# Patient Record
Sex: Male | Born: 1945 | Race: Black or African American | Hispanic: No
Health system: Southern US, Community
[De-identification: ages and names within clinical notes are randomized; demographics above are authoritative.]

---

## 2014-09-06 ENCOUNTER — Inpatient Hospital Stay
Admission: AD | Admit: 2014-09-06 | Discharge: 2014-09-27 | Disposition: E | Payer: Self-pay | Source: Ambulatory Visit | Attending: Internal Medicine | Admitting: Internal Medicine

## 2014-09-06 ENCOUNTER — Other Ambulatory Visit (HOSPITAL_COMMUNITY): Payer: Self-pay

## 2014-09-07 LAB — MAGNESIUM: Magnesium: 1.3 mg/dL — ABNORMAL LOW (ref 1.5–2.5)

## 2014-09-07 LAB — CBC WITH DIFFERENTIAL/PLATELET
BASOS ABS: 0 10*3/uL (ref 0.0–0.1)
BASOS PCT: 0 % (ref 0–1)
EOS ABS: 0.1 10*3/uL (ref 0.0–0.7)
Eosinophils Relative: 1 % (ref 0–5)
HEMATOCRIT: 29.1 % — AB (ref 39.0–52.0)
Hemoglobin: 10 g/dL — ABNORMAL LOW (ref 13.0–17.0)
LYMPHS ABS: 2.5 10*3/uL (ref 0.7–4.0)
Lymphocytes Relative: 17 % (ref 12–46)
MCH: 32.4 pg (ref 26.0–34.0)
MCHC: 34.4 g/dL (ref 30.0–36.0)
MCV: 94.2 fL (ref 78.0–100.0)
Monocytes Absolute: 1.3 10*3/uL — ABNORMAL HIGH (ref 0.1–1.0)
Monocytes Relative: 9 % (ref 3–12)
NEUTROS ABS: 10.8 10*3/uL — AB (ref 1.7–7.7)
Neutrophils Relative %: 73 % (ref 43–77)
Platelets: 110 10*3/uL — ABNORMAL LOW (ref 150–400)
RBC: 3.09 MIL/uL — ABNORMAL LOW (ref 4.22–5.81)
RDW: 18.7 % — AB (ref 11.5–15.5)
WBC: 14.7 10*3/uL — ABNORMAL HIGH (ref 4.0–10.5)

## 2014-09-07 LAB — PRO B NATRIURETIC PEPTIDE: Pro B Natriuretic peptide (BNP): 39684 pg/mL — ABNORMAL HIGH (ref 0–125)

## 2014-09-07 LAB — COMPREHENSIVE METABOLIC PANEL
ALT: 8 U/L (ref 0–53)
ANION GAP: 14 (ref 5–15)
AST: 23 U/L (ref 0–37)
Albumin: 2.7 g/dL — ABNORMAL LOW (ref 3.5–5.2)
Alkaline Phosphatase: 40 U/L (ref 39–117)
BUN: 18 mg/dL (ref 6–23)
CALCIUM: 7.4 mg/dL — AB (ref 8.4–10.5)
CO2: 18 mEq/L — ABNORMAL LOW (ref 19–32)
Chloride: 113 mEq/L — ABNORMAL HIGH (ref 96–112)
Creatinine, Ser: 1.18 mg/dL (ref 0.50–1.35)
GFR calc Af Amer: 71 mL/min — ABNORMAL LOW (ref >90)
GFR, EST NON AFRICAN AMERICAN: 62 mL/min — AB (ref >90)
Glucose, Bld: 230 mg/dL — ABNORMAL HIGH (ref 70–99)
Potassium: 4.4 mEq/L (ref 3.7–5.3)
Sodium: 145 mEq/L (ref 137–147)
Total Bilirubin: 0.5 mg/dL (ref 0.3–1.2)
Total Protein: 5.4 g/dL — ABNORMAL LOW (ref 6.0–8.3)

## 2014-09-07 LAB — URINALYSIS, ROUTINE W REFLEX MICROSCOPIC
Bilirubin Urine: NEGATIVE
Glucose, UA: NEGATIVE mg/dL
KETONES UR: NEGATIVE mg/dL
Nitrite: NEGATIVE
PH: 5.5 (ref 5.0–8.0)
Protein, ur: NEGATIVE mg/dL
Specific Gravity, Urine: 1.022 (ref 1.005–1.030)
Urobilinogen, UA: 0.2 mg/dL (ref 0.0–1.0)

## 2014-09-07 LAB — URINE MICROSCOPIC-ADD ON

## 2014-09-07 LAB — IRON AND TIBC
Iron: 32 ug/dL — ABNORMAL LOW (ref 42–135)
Saturation Ratios: 41 % (ref 20–55)
TIBC: 78 ug/dL — ABNORMAL LOW (ref 215–435)
UIBC: 46 ug/dL — ABNORMAL LOW (ref 125–400)

## 2014-09-07 LAB — PROTIME-INR
INR: 2.61 — ABNORMAL HIGH (ref 0.00–1.49)
Prothrombin Time: 27.9 seconds — ABNORMAL HIGH (ref 11.6–15.2)

## 2014-09-07 LAB — T4, FREE: Free T4: 0.83 ng/dL (ref 0.80–1.80)

## 2014-09-07 LAB — PREALBUMIN: PREALBUMIN: 6 mg/dL — AB (ref 17.0–34.0)

## 2014-09-07 LAB — LIPID PANEL
CHOL/HDL RATIO: 1.4 ratio
Cholesterol: 48 mg/dL (ref 0–200)
HDL: 35 mg/dL — AB (ref >39)
LDL CALC: 1 mg/dL (ref 0–99)
TRIGLYCERIDES: 58 mg/dL (ref <150)
VLDL: 12 mg/dL (ref 0–40)

## 2014-09-07 LAB — PROCALCITONIN: PROCALCITONIN: 0.3 ng/mL

## 2014-09-07 LAB — TSH: TSH: 11.66 u[IU]/mL — ABNORMAL HIGH (ref 0.350–4.500)

## 2014-09-07 LAB — PHOSPHORUS: PHOSPHORUS: 1.9 mg/dL — AB (ref 2.3–4.6)

## 2014-09-07 LAB — VANCOMYCIN, RANDOM: VANCOMYCIN RM: 15.4 ug/mL

## 2014-09-07 LAB — VITAMIN B12: Vitamin B-12: 751 pg/mL (ref 211–911)

## 2014-09-08 ENCOUNTER — Other Ambulatory Visit (HOSPITAL_COMMUNITY): Payer: Self-pay

## 2014-09-08 LAB — BASIC METABOLIC PANEL
ANION GAP: 11 (ref 5–15)
BUN: 21 mg/dL (ref 6–23)
CALCIUM: 7.5 mg/dL — AB (ref 8.4–10.5)
CO2: 19 meq/L (ref 19–32)
CREATININE: 1.18 mg/dL (ref 0.50–1.35)
Chloride: 115 mEq/L — ABNORMAL HIGH (ref 96–112)
GFR calc non Af Amer: 62 mL/min — ABNORMAL LOW (ref >90)
GFR, EST AFRICAN AMERICAN: 71 mL/min — AB (ref >90)
Glucose, Bld: 189 mg/dL — ABNORMAL HIGH (ref 70–99)
Potassium: 4.4 mEq/L (ref 3.7–5.3)
SODIUM: 145 meq/L (ref 137–147)

## 2014-09-08 LAB — URINE CULTURE
COLONY COUNT: NO GROWTH
CULTURE: NO GROWTH
Special Requests: NORMAL

## 2014-09-08 LAB — CBC WITH DIFFERENTIAL/PLATELET
Basophils Absolute: 0 10*3/uL (ref 0.0–0.1)
Basophils Relative: 0 % (ref 0–1)
EOS PCT: 0 % (ref 0–5)
Eosinophils Absolute: 0 10*3/uL (ref 0.0–0.7)
HCT: 27.6 % — ABNORMAL LOW (ref 39.0–52.0)
Hemoglobin: 9.7 g/dL — ABNORMAL LOW (ref 13.0–17.0)
Lymphocytes Relative: 13 % (ref 12–46)
Lymphs Abs: 2 10*3/uL (ref 0.7–4.0)
MCH: 33.1 pg (ref 26.0–34.0)
MCHC: 35.1 g/dL (ref 30.0–36.0)
MCV: 94.2 fL (ref 78.0–100.0)
MONO ABS: 1.5 10*3/uL — AB (ref 0.1–1.0)
MONOS PCT: 10 % (ref 3–12)
NEUTROS PCT: 77 % (ref 43–77)
Neutro Abs: 11.6 10*3/uL — ABNORMAL HIGH (ref 1.7–7.7)
Platelets: 99 10*3/uL — ABNORMAL LOW (ref 150–400)
RBC: 2.93 MIL/uL — AB (ref 4.22–5.81)
RDW: 18.4 % — ABNORMAL HIGH (ref 11.5–15.5)
WBC: 15.1 10*3/uL — AB (ref 4.0–10.5)

## 2014-09-08 LAB — MAGNESIUM: Magnesium: 1.8 mg/dL (ref 1.5–2.5)

## 2014-09-08 LAB — PHOSPHORUS: Phosphorus: 2.7 mg/dL (ref 2.3–4.6)

## 2014-09-08 LAB — FOLATE RBC: RBC Folate: 1054 ng/mL — ABNORMAL HIGH (ref >280)

## 2014-09-08 LAB — HEMOGLOBIN A1C
Hgb A1c MFr Bld: 5.8 % — ABNORMAL HIGH (ref <5.7)
Mean Plasma Glucose: 120 mg/dL — ABNORMAL HIGH (ref <117)

## 2014-09-09 ENCOUNTER — Other Ambulatory Visit (HOSPITAL_COMMUNITY): Payer: Self-pay

## 2014-09-09 LAB — MAGNESIUM: MAGNESIUM: 1.6 mg/dL (ref 1.5–2.5)

## 2014-09-09 LAB — CBC WITH DIFFERENTIAL/PLATELET
BASOS ABS: 0 10*3/uL (ref 0.0–0.1)
Basophils Relative: 0 % (ref 0–1)
EOS ABS: 0.2 10*3/uL (ref 0.0–0.7)
Eosinophils Relative: 1 % (ref 0–5)
HCT: 30.6 % — ABNORMAL LOW (ref 39.0–52.0)
Hemoglobin: 10.6 g/dL — ABNORMAL LOW (ref 13.0–17.0)
LYMPHS ABS: 2.4 10*3/uL (ref 0.7–4.0)
Lymphocytes Relative: 15 % (ref 12–46)
MCH: 33 pg (ref 26.0–34.0)
MCHC: 34.6 g/dL (ref 30.0–36.0)
MCV: 95.3 fL (ref 78.0–100.0)
MONO ABS: 1.8 10*3/uL — AB (ref 0.1–1.0)
Monocytes Relative: 11 % (ref 3–12)
NEUTROS PCT: 73 % (ref 43–77)
Neutro Abs: 11.9 10*3/uL — ABNORMAL HIGH (ref 1.7–7.7)
PLATELETS: 128 10*3/uL — AB (ref 150–400)
RBC: 3.21 MIL/uL — ABNORMAL LOW (ref 4.22–5.81)
RDW: 18.2 % — AB (ref 11.5–15.5)
WBC: 16.3 10*3/uL — ABNORMAL HIGH (ref 4.0–10.5)

## 2014-09-09 LAB — BASIC METABOLIC PANEL
ANION GAP: 10 (ref 5–15)
BUN: 25 mg/dL — ABNORMAL HIGH (ref 6–23)
CALCIUM: 8.3 mg/dL — AB (ref 8.4–10.5)
CO2: 23 mEq/L (ref 19–32)
CREATININE: 1.29 mg/dL (ref 0.50–1.35)
Chloride: 110 mEq/L (ref 96–112)
GFR, EST AFRICAN AMERICAN: 64 mL/min — AB (ref >90)
GFR, EST NON AFRICAN AMERICAN: 55 mL/min — AB (ref >90)
Glucose, Bld: 85 mg/dL (ref 70–99)
Potassium: 4.5 mEq/L (ref 3.7–5.3)
SODIUM: 143 meq/L (ref 137–147)

## 2014-09-09 LAB — AMMONIA: Ammonia: 10 umol/L — ABNORMAL LOW (ref 11–60)

## 2014-09-09 LAB — PHOSPHORUS: Phosphorus: 1.8 mg/dL — ABNORMAL LOW (ref 2.3–4.6)

## 2014-09-10 ENCOUNTER — Other Ambulatory Visit (HOSPITAL_COMMUNITY): Payer: Self-pay

## 2014-09-10 LAB — COMPREHENSIVE METABOLIC PANEL
ALBUMIN: 2.6 g/dL — AB (ref 3.5–5.2)
ALT: 98 U/L — AB (ref 0–53)
ANION GAP: 13 (ref 5–15)
AST: 248 U/L — ABNORMAL HIGH (ref 0–37)
Alkaline Phosphatase: 60 U/L (ref 39–117)
BUN: 25 mg/dL — ABNORMAL HIGH (ref 6–23)
CALCIUM: 7.9 mg/dL — AB (ref 8.4–10.5)
CO2: 22 mEq/L (ref 19–32)
Chloride: 103 mEq/L (ref 96–112)
Creatinine, Ser: 1.29 mg/dL (ref 0.50–1.35)
GFR calc Af Amer: 64 mL/min — ABNORMAL LOW (ref >90)
GFR calc non Af Amer: 55 mL/min — ABNORMAL LOW (ref >90)
Glucose, Bld: 145 mg/dL — ABNORMAL HIGH (ref 70–99)
POTASSIUM: 4.8 meq/L (ref 3.7–5.3)
SODIUM: 138 meq/L (ref 137–147)
TOTAL PROTEIN: 5.9 g/dL — AB (ref 6.0–8.3)
Total Bilirubin: 0.5 mg/dL (ref 0.3–1.2)

## 2014-09-10 LAB — PREALBUMIN: PREALBUMIN: 8.6 mg/dL — AB (ref 17.0–34.0)

## 2014-09-10 LAB — CBC WITH DIFFERENTIAL/PLATELET
Basophils Absolute: 0.2 10*3/uL — ABNORMAL HIGH (ref 0.0–0.1)
Basophils Relative: 1 % (ref 0–1)
EOS ABS: 0.2 10*3/uL (ref 0.0–0.7)
Eosinophils Relative: 1 % (ref 0–5)
HCT: 28.4 % — ABNORMAL LOW (ref 39.0–52.0)
Hemoglobin: 9.9 g/dL — ABNORMAL LOW (ref 13.0–17.0)
Lymphocytes Relative: 18 % (ref 12–46)
Lymphs Abs: 2.9 10*3/uL (ref 0.7–4.0)
MCH: 32.7 pg (ref 26.0–34.0)
MCHC: 34.9 g/dL (ref 30.0–36.0)
MCV: 93.7 fL (ref 78.0–100.0)
MONO ABS: 1.9 10*3/uL — AB (ref 0.1–1.0)
Monocytes Relative: 12 % (ref 3–12)
NEUTROS PCT: 68 % (ref 43–77)
Neutro Abs: 11 10*3/uL — ABNORMAL HIGH (ref 1.7–7.7)
Platelets: 133 10*3/uL — ABNORMAL LOW (ref 150–400)
RBC: 3.03 MIL/uL — ABNORMAL LOW (ref 4.22–5.81)
RDW: 18 % — AB (ref 11.5–15.5)
Smear Review: DECREASED
WBC: 16.2 10*3/uL — ABNORMAL HIGH (ref 4.0–10.5)

## 2014-09-10 LAB — PHOSPHORUS: PHOSPHORUS: 1.9 mg/dL — AB (ref 2.3–4.6)

## 2014-09-10 LAB — MAGNESIUM: Magnesium: 1.4 mg/dL — ABNORMAL LOW (ref 1.5–2.5)

## 2014-09-10 MED ORDER — LIDOCAINE HCL (PF) 1 % IJ SOLN
INTRAMUSCULAR | Status: AC
  Filled 2014-09-10: qty 10

## 2014-09-10 NOTE — Procedures (Signed)
Rt US guided Thora  1 liter yellow fluid Pt tolerated well  Sent for labs  cxr pending

## 2014-09-11 ENCOUNTER — Other Ambulatory Visit (HOSPITAL_COMMUNITY): Payer: Self-pay

## 2014-09-11 LAB — CBC WITH DIFFERENTIAL/PLATELET
Basophils Absolute: 0.1 10*3/uL (ref 0.0–0.1)
Basophils Relative: 1 % (ref 0–1)
EOS ABS: 0.1 10*3/uL (ref 0.0–0.7)
EOS PCT: 1 % (ref 0–5)
HEMATOCRIT: 29 % — AB (ref 39.0–52.0)
Hemoglobin: 10.1 g/dL — ABNORMAL LOW (ref 13.0–17.0)
LYMPHS ABS: 2 10*3/uL (ref 0.7–4.0)
LYMPHS PCT: 17 % (ref 12–46)
MCH: 32 pg (ref 26.0–34.0)
MCHC: 34.8 g/dL (ref 30.0–36.0)
MCV: 91.8 fL (ref 78.0–100.0)
MONO ABS: 1.3 10*3/uL — AB (ref 0.1–1.0)
Monocytes Relative: 11 % (ref 3–12)
Neutro Abs: 8.2 10*3/uL — ABNORMAL HIGH (ref 1.7–7.7)
Neutrophils Relative %: 70 % (ref 43–77)
PLATELETS: 165 10*3/uL (ref 150–400)
RBC: 3.16 MIL/uL — ABNORMAL LOW (ref 4.22–5.81)
RDW: 17.5 % — ABNORMAL HIGH (ref 11.5–15.5)
WBC: 11.7 10*3/uL — AB (ref 4.0–10.5)

## 2014-09-11 LAB — BASIC METABOLIC PANEL
Anion gap: 11 (ref 5–15)
BUN: 24 mg/dL — ABNORMAL HIGH (ref 6–23)
CALCIUM: 7.8 mg/dL — AB (ref 8.4–10.5)
CO2: 25 mEq/L (ref 19–32)
CREATININE: 1.3 mg/dL (ref 0.50–1.35)
Chloride: 101 mEq/L (ref 96–112)
GFR calc Af Amer: 64 mL/min — ABNORMAL LOW (ref >90)
GFR calc non Af Amer: 55 mL/min — ABNORMAL LOW (ref >90)
GLUCOSE: 82 mg/dL (ref 70–99)
Potassium: 4.2 mEq/L (ref 3.7–5.3)
SODIUM: 137 meq/L (ref 137–147)

## 2014-09-11 LAB — AMYLASE: Amylase: 60 U/L (ref 0–105)

## 2014-09-11 LAB — MAGNESIUM: Magnesium: 1.4 mg/dL — ABNORMAL LOW (ref 1.5–2.5)

## 2014-09-11 LAB — LIPASE, BLOOD: Lipase: 4 U/L — ABNORMAL LOW (ref 11–59)

## 2014-09-11 LAB — AMYLASE, PLEURAL FLUID: Amylase, Pleural Fluid: 17 U/L

## 2014-09-11 LAB — PHOSPHORUS: Phosphorus: 2.4 mg/dL (ref 2.3–4.6)

## 2014-09-12 ENCOUNTER — Other Ambulatory Visit (HOSPITAL_COMMUNITY): Payer: Self-pay

## 2014-09-12 LAB — BASIC METABOLIC PANEL
ANION GAP: 13 (ref 5–15)
BUN: 27 mg/dL — ABNORMAL HIGH (ref 6–23)
CHLORIDE: 95 meq/L — AB (ref 96–112)
CO2: 25 meq/L (ref 19–32)
CREATININE: 1.44 mg/dL — AB (ref 0.50–1.35)
Calcium: 7.9 mg/dL — ABNORMAL LOW (ref 8.4–10.5)
GFR calc Af Amer: 56 mL/min — ABNORMAL LOW (ref >90)
GFR calc non Af Amer: 48 mL/min — ABNORMAL LOW (ref >90)
Glucose, Bld: 204 mg/dL — ABNORMAL HIGH (ref 70–99)
Potassium: 4.1 mEq/L (ref 3.7–5.3)
Sodium: 133 mEq/L — ABNORMAL LOW (ref 137–147)

## 2014-09-12 LAB — CBC WITH DIFFERENTIAL/PLATELET
BASOS ABS: 0.1 10*3/uL (ref 0.0–0.1)
Basophils Relative: 1 % (ref 0–1)
EOS PCT: 1 % (ref 0–5)
Eosinophils Absolute: 0.1 10*3/uL (ref 0.0–0.7)
HEMATOCRIT: 27.3 % — AB (ref 39.0–52.0)
Hemoglobin: 9.6 g/dL — ABNORMAL LOW (ref 13.0–17.0)
LYMPHS ABS: 1.9 10*3/uL (ref 0.7–4.0)
Lymphocytes Relative: 15 % (ref 12–46)
MCH: 32.1 pg (ref 26.0–34.0)
MCHC: 35.2 g/dL (ref 30.0–36.0)
MCV: 91.3 fL (ref 78.0–100.0)
MONO ABS: 1.5 10*3/uL — AB (ref 0.1–1.0)
Monocytes Relative: 12 % (ref 3–12)
NEUTROS ABS: 9 10*3/uL — AB (ref 1.7–7.7)
Neutrophils Relative %: 71 % (ref 43–77)
Platelets: 166 10*3/uL (ref 150–400)
RBC: 2.99 MIL/uL — ABNORMAL LOW (ref 4.22–5.81)
RDW: 17.5 % — AB (ref 11.5–15.5)
WBC: 12.5 10*3/uL — AB (ref 4.0–10.5)

## 2014-09-12 LAB — MAGNESIUM: Magnesium: 1.8 mg/dL (ref 1.5–2.5)

## 2014-09-12 LAB — PHOSPHORUS: Phosphorus: 2.4 mg/dL (ref 2.3–4.6)

## 2014-09-13 ENCOUNTER — Other Ambulatory Visit (HOSPITAL_COMMUNITY): Payer: Self-pay

## 2014-09-13 LAB — COMPREHENSIVE METABOLIC PANEL
ALT: 45 U/L (ref 0–53)
AST: 61 U/L — ABNORMAL HIGH (ref 0–37)
Albumin: 2.5 g/dL — ABNORMAL LOW (ref 3.5–5.2)
Alkaline Phosphatase: 102 U/L (ref 39–117)
Anion gap: 16 — ABNORMAL HIGH (ref 5–15)
BUN: 34 mg/dL — ABNORMAL HIGH (ref 6–23)
CO2: 24 mEq/L (ref 19–32)
Calcium: 8.3 mg/dL — ABNORMAL LOW (ref 8.4–10.5)
Chloride: 91 mEq/L — ABNORMAL LOW (ref 96–112)
Creatinine, Ser: 1.52 mg/dL — ABNORMAL HIGH (ref 0.50–1.35)
GFR calc Af Amer: 53 mL/min — ABNORMAL LOW (ref >90)
GFR calc non Af Amer: 45 mL/min — ABNORMAL LOW (ref >90)
Glucose, Bld: 157 mg/dL — ABNORMAL HIGH (ref 70–99)
Potassium: 4 mEq/L (ref 3.7–5.3)
Sodium: 131 mEq/L — ABNORMAL LOW (ref 137–147)
Total Bilirubin: 0.8 mg/dL (ref 0.3–1.2)
Total Protein: 5.7 g/dL — ABNORMAL LOW (ref 6.0–8.3)

## 2014-09-13 LAB — CBC WITH DIFFERENTIAL/PLATELET
BASOS ABS: 0 10*3/uL (ref 0.0–0.1)
Basophils Relative: 0 % (ref 0–1)
Eosinophils Absolute: 0 10*3/uL (ref 0.0–0.7)
Eosinophils Relative: 0 % (ref 0–5)
HCT: 26.6 % — ABNORMAL LOW (ref 39.0–52.0)
Hemoglobin: 9.7 g/dL — ABNORMAL LOW (ref 13.0–17.0)
Lymphocytes Relative: 4 % — ABNORMAL LOW (ref 12–46)
Lymphs Abs: 0.8 10*3/uL (ref 0.7–4.0)
MCH: 33.4 pg (ref 26.0–34.0)
MCHC: 36.5 g/dL — ABNORMAL HIGH (ref 30.0–36.0)
MCV: 91.7 fL (ref 78.0–100.0)
MONO ABS: 2 10*3/uL — AB (ref 0.1–1.0)
Monocytes Relative: 10 % (ref 3–12)
NEUTROS ABS: 18 10*3/uL — AB (ref 1.7–7.7)
NEUTROS PCT: 86 % — AB (ref 43–77)
Platelets: 170 10*3/uL (ref 150–400)
RBC: 2.9 MIL/uL — ABNORMAL LOW (ref 4.22–5.81)
RDW: 17.5 % — AB (ref 11.5–15.5)
WBC: 20.8 10*3/uL — ABNORMAL HIGH (ref 4.0–10.5)

## 2014-09-13 LAB — URINALYSIS, ROUTINE W REFLEX MICROSCOPIC
Bilirubin Urine: NEGATIVE
Glucose, UA: NEGATIVE mg/dL
Ketones, ur: NEGATIVE mg/dL
Leukocytes, UA: NEGATIVE
NITRITE: NEGATIVE
PROTEIN: NEGATIVE mg/dL
Urobilinogen, UA: 0.2 mg/dL (ref 0.0–1.0)
pH: 5 (ref 5.0–8.0)

## 2014-09-13 LAB — URINE MICROSCOPIC-ADD ON

## 2014-09-13 LAB — MAGNESIUM: Magnesium: 1.6 mg/dL (ref 1.5–2.5)

## 2014-09-13 LAB — PHOSPHORUS: Phosphorus: 2.3 mg/dL (ref 2.3–4.6)

## 2014-09-14 ENCOUNTER — Other Ambulatory Visit (HOSPITAL_COMMUNITY): Payer: Self-pay

## 2014-09-14 LAB — CBC WITH DIFFERENTIAL/PLATELET
BASOS PCT: 0 % (ref 0–1)
Basophils Absolute: 0 10*3/uL (ref 0.0–0.1)
Eosinophils Absolute: 0 10*3/uL (ref 0.0–0.7)
Eosinophils Relative: 0 % (ref 0–5)
HEMATOCRIT: 27 % — AB (ref 39.0–52.0)
HEMOGLOBIN: 9.6 g/dL — AB (ref 13.0–17.0)
LYMPHS PCT: 3 % — AB (ref 12–46)
Lymphs Abs: 0.8 10*3/uL (ref 0.7–4.0)
MCH: 32.7 pg (ref 26.0–34.0)
MCHC: 35.6 g/dL (ref 30.0–36.0)
MCV: 91.8 fL (ref 78.0–100.0)
Monocytes Absolute: 1.8 10*3/uL — ABNORMAL HIGH (ref 0.1–1.0)
Monocytes Relative: 7 % (ref 3–12)
NEUTROS ABS: 22.9 10*3/uL — AB (ref 1.7–7.7)
Neutrophils Relative %: 90 % — ABNORMAL HIGH (ref 43–77)
Platelets: DECREASED 10*3/uL (ref 150–400)
RBC: 2.94 MIL/uL — ABNORMAL LOW (ref 4.22–5.81)
RDW: 17.7 % — ABNORMAL HIGH (ref 11.5–15.5)
WBC: 25.5 10*3/uL — ABNORMAL HIGH (ref 4.0–10.5)

## 2014-09-14 LAB — BODY FLUID CULTURE
Culture: NO GROWTH
GRAM STAIN: NONE SEEN

## 2014-09-14 LAB — PROCALCITONIN: PROCALCITONIN: 0.63 ng/mL

## 2014-09-14 LAB — BASIC METABOLIC PANEL
Anion gap: 21 — ABNORMAL HIGH (ref 5–15)
BUN: 41 mg/dL — AB (ref 6–23)
CO2: 21 meq/L (ref 19–32)
Calcium: 8.3 mg/dL — ABNORMAL LOW (ref 8.4–10.5)
Chloride: 85 mEq/L — ABNORMAL LOW (ref 96–112)
Creatinine, Ser: 1.73 mg/dL — ABNORMAL HIGH (ref 0.50–1.35)
GFR calc Af Amer: 45 mL/min — ABNORMAL LOW (ref >90)
GFR, EST NON AFRICAN AMERICAN: 39 mL/min — AB (ref >90)
Glucose, Bld: 193 mg/dL — ABNORMAL HIGH (ref 70–99)
Potassium: 3.9 mEq/L (ref 3.7–5.3)
SODIUM: 127 meq/L — AB (ref 137–147)

## 2014-09-15 LAB — CBC
HCT: 25.3 % — ABNORMAL LOW (ref 39.0–52.0)
Hemoglobin: 8.9 g/dL — ABNORMAL LOW (ref 13.0–17.0)
MCH: 32.5 pg (ref 26.0–34.0)
MCHC: 35.2 g/dL (ref 30.0–36.0)
MCV: 92.3 fL (ref 78.0–100.0)
PLATELETS: 165 10*3/uL (ref 150–400)
RBC: 2.74 MIL/uL — ABNORMAL LOW (ref 4.22–5.81)
RDW: 17.5 % — AB (ref 11.5–15.5)
WBC: 17.9 10*3/uL — ABNORMAL HIGH (ref 4.0–10.5)

## 2014-09-15 LAB — URINE CULTURE
COLONY COUNT: NO GROWTH
Culture: NO GROWTH

## 2014-09-15 LAB — BASIC METABOLIC PANEL
ANION GAP: 18 — AB (ref 5–15)
BUN: 48 mg/dL — ABNORMAL HIGH (ref 6–23)
CALCIUM: 8.6 mg/dL (ref 8.4–10.5)
CO2: 23 mEq/L (ref 19–32)
Chloride: 83 mEq/L — ABNORMAL LOW (ref 96–112)
Creatinine, Ser: 2.09 mg/dL — ABNORMAL HIGH (ref 0.50–1.35)
GFR calc non Af Amer: 31 mL/min — ABNORMAL LOW (ref >90)
GFR, EST AFRICAN AMERICAN: 36 mL/min — AB (ref >90)
Glucose, Bld: 251 mg/dL — ABNORMAL HIGH (ref 70–99)
Potassium: 3.6 mEq/L — ABNORMAL LOW (ref 3.7–5.3)
SODIUM: 124 meq/L — AB (ref 137–147)

## 2014-09-16 ENCOUNTER — Other Ambulatory Visit (HOSPITAL_COMMUNITY): Payer: Self-pay

## 2014-09-16 LAB — BASIC METABOLIC PANEL
Anion gap: 17 — ABNORMAL HIGH (ref 5–15)
BUN: 55 mg/dL — AB (ref 6–23)
CO2: 25 mEq/L (ref 19–32)
CREATININE: 2.35 mg/dL — AB (ref 0.50–1.35)
Calcium: 8.5 mg/dL (ref 8.4–10.5)
Chloride: 84 mEq/L — ABNORMAL LOW (ref 96–112)
GFR, EST AFRICAN AMERICAN: 31 mL/min — AB (ref >90)
GFR, EST NON AFRICAN AMERICAN: 27 mL/min — AB (ref >90)
Glucose, Bld: 254 mg/dL — ABNORMAL HIGH (ref 70–99)
Potassium: 3.5 mEq/L — ABNORMAL LOW (ref 3.7–5.3)
Sodium: 126 mEq/L — ABNORMAL LOW (ref 137–147)

## 2014-09-16 LAB — PRO B NATRIURETIC PEPTIDE: Pro B Natriuretic peptide (BNP): >70000 pg/mL — ABNORMAL HIGH (ref 0–125)

## 2014-09-17 ENCOUNTER — Other Ambulatory Visit (HOSPITAL_COMMUNITY): Payer: Self-pay

## 2014-09-17 LAB — COMPREHENSIVE METABOLIC PANEL
ALT: 120 U/L — ABNORMAL HIGH (ref 0–53)
ANION GAP: 18 — AB (ref 5–15)
AST: 181 U/L — ABNORMAL HIGH (ref 0–37)
Albumin: 2.5 g/dL — ABNORMAL LOW (ref 3.5–5.2)
Alkaline Phosphatase: 282 U/L — ABNORMAL HIGH (ref 39–117)
BILIRUBIN TOTAL: 0.7 mg/dL (ref 0.3–1.2)
BUN: 62 mg/dL — AB (ref 6–23)
CO2: 22 mEq/L (ref 19–32)
Calcium: 8.1 mg/dL — ABNORMAL LOW (ref 8.4–10.5)
Chloride: 83 mEq/L — ABNORMAL LOW (ref 96–112)
Creatinine, Ser: 2.64 mg/dL — ABNORMAL HIGH (ref 0.50–1.35)
GFR calc non Af Amer: 23 mL/min — ABNORMAL LOW (ref >90)
GFR, EST AFRICAN AMERICAN: 27 mL/min — AB (ref >90)
GLUCOSE: 205 mg/dL — AB (ref 70–99)
Potassium: 4.2 mEq/L (ref 3.7–5.3)
Sodium: 123 mEq/L — ABNORMAL LOW (ref 137–147)
TOTAL PROTEIN: 6.5 g/dL (ref 6.0–8.3)

## 2014-09-17 LAB — PHOSPHORUS: PHOSPHORUS: 5.1 mg/dL — AB (ref 2.3–4.6)

## 2014-09-17 LAB — PREALBUMIN: Prealbumin: 17.4 mg/dL — ABNORMAL LOW (ref 17.0–34.0)

## 2014-09-17 LAB — MAGNESIUM: Magnesium: 1.8 mg/dL (ref 1.5–2.5)

## 2014-09-18 LAB — CBC
HEMATOCRIT: 29.4 % — AB (ref 39.0–52.0)
Hemoglobin: 10.2 g/dL — ABNORMAL LOW (ref 13.0–17.0)
MCH: 32.6 pg (ref 26.0–34.0)
MCHC: 34.7 g/dL (ref 30.0–36.0)
MCV: 93.9 fL (ref 78.0–100.0)
PLATELETS: 257 10*3/uL (ref 150–400)
RBC: 3.13 MIL/uL — ABNORMAL LOW (ref 4.22–5.81)
RDW: 16.8 % — AB (ref 11.5–15.5)
WBC: 15.6 10*3/uL — ABNORMAL HIGH (ref 4.0–10.5)

## 2014-09-18 LAB — BASIC METABOLIC PANEL
Anion gap: 16 — ABNORMAL HIGH (ref 5–15)
BUN: 69 mg/dL — AB (ref 6–23)
CALCIUM: 7.8 mg/dL — AB (ref 8.4–10.5)
CHLORIDE: 81 meq/L — AB (ref 96–112)
CO2: 23 mEq/L (ref 19–32)
CREATININE: 3.1 mg/dL — AB (ref 0.50–1.35)
GFR calc Af Amer: 22 mL/min — ABNORMAL LOW (ref >90)
GFR, EST NON AFRICAN AMERICAN: 19 mL/min — AB (ref >90)
Glucose, Bld: 243 mg/dL — ABNORMAL HIGH (ref 70–99)
Potassium: 4.7 mEq/L (ref 3.7–5.3)
Sodium: 120 mEq/L — CL (ref 137–147)

## 2014-09-19 LAB — CULTURE, BLOOD (ROUTINE X 2): CULTURE: NO GROWTH

## 2014-09-20 LAB — CULTURE, BLOOD (ROUTINE X 2): CULTURE: NO GROWTH

## 2014-09-27 DEATH — deceased

## 2015-04-25 IMAGING — US US ABDOMEN COMPLETE
1 series · 13 of 25 positions shown · non-contrast
Comparison: Previous examinations, including the abdomen CT dated
09/05/2014.

CLINICAL DATA: Cirrhosis of the liver. Leukocytosis. Elevated liver
function tests.

EXAM:
ULTRASOUND ABDOMEN COMPLETE

[Series 1: us abdomen complete · 0.17mm/px · 13 of 78 slices shown]
[im 1/78]
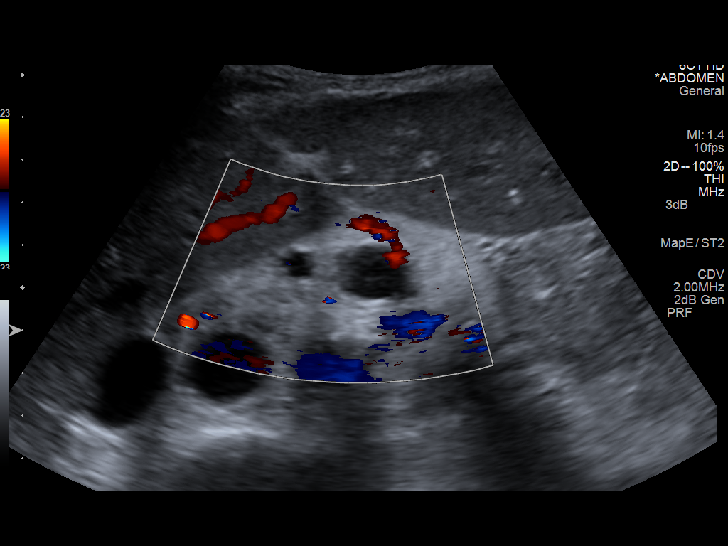
[im 7/78]
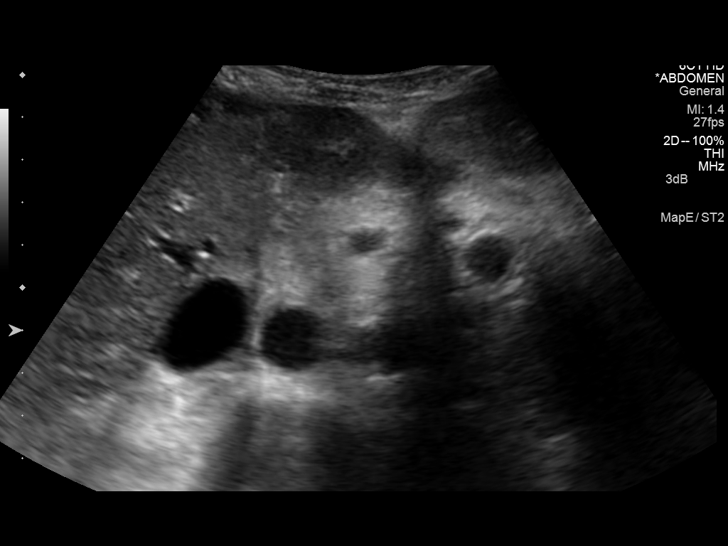
[im 13/78]
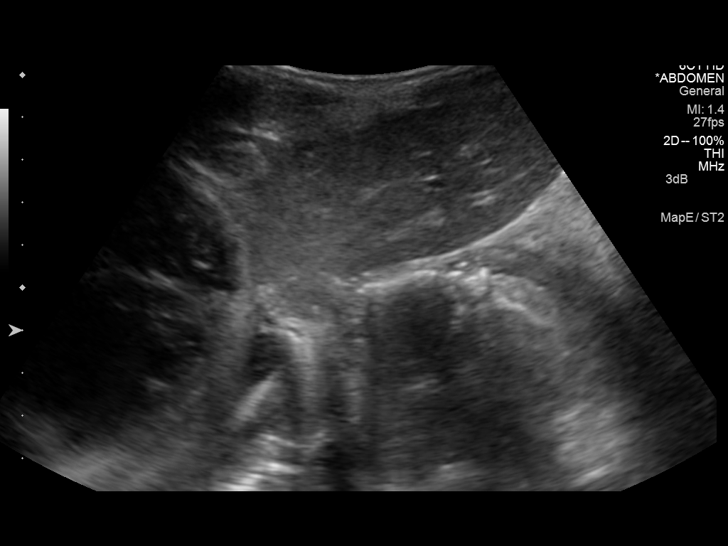
[im 20/78]
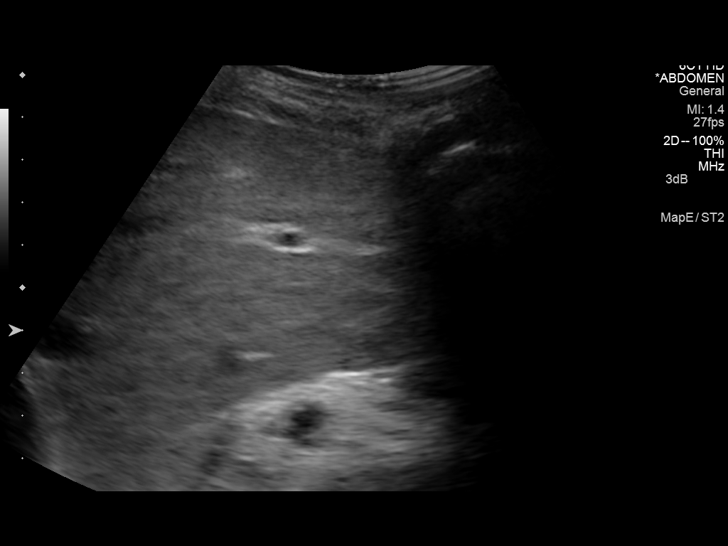
[im 26/78]
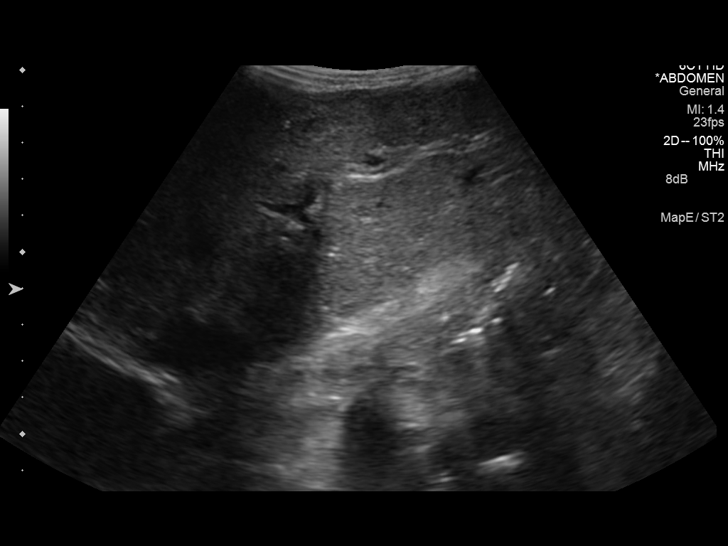
[im 33/78]
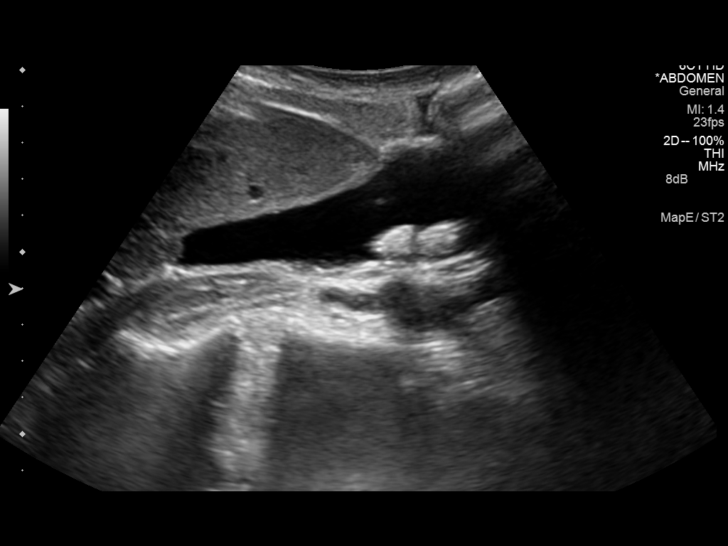
[im 39/78]
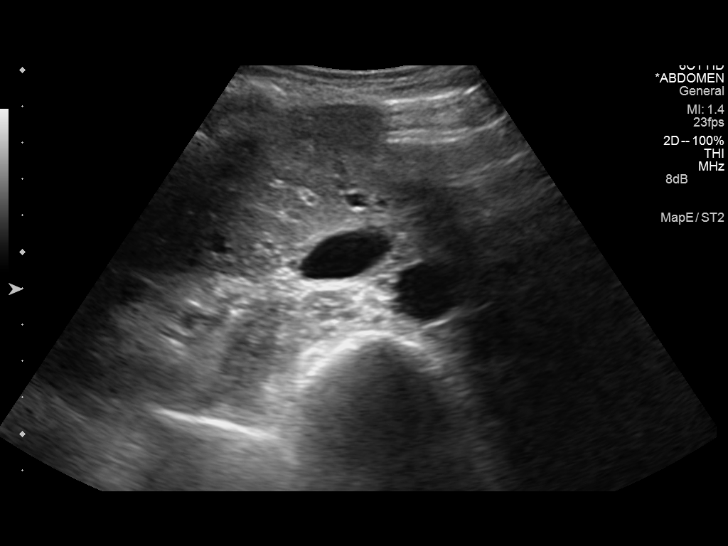
[im 45/78]
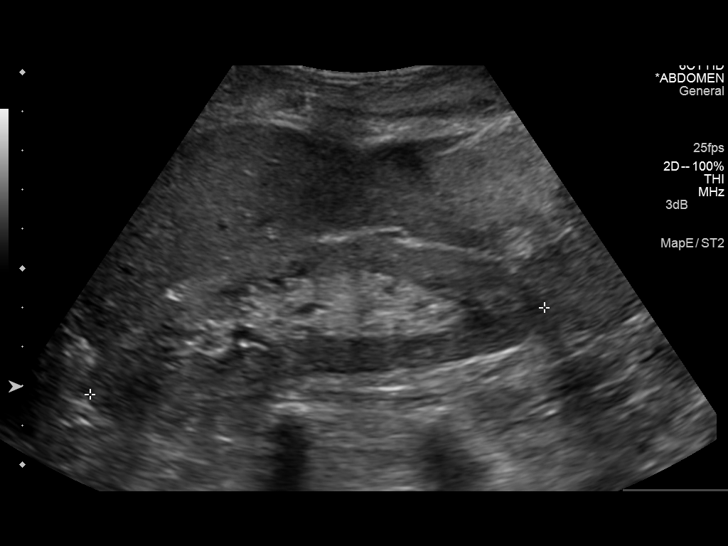
[im 52/78]
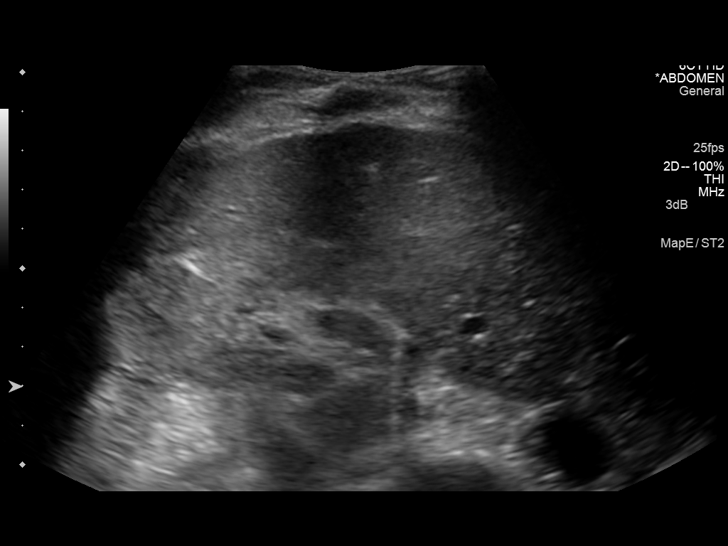
[im 58/78]
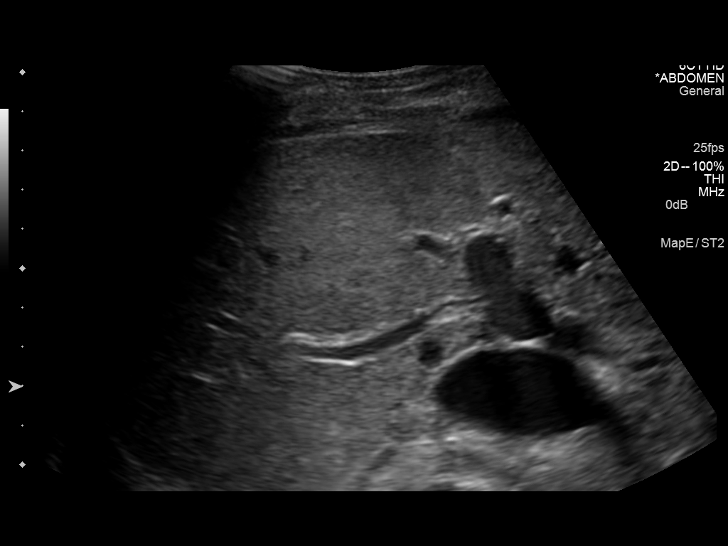
[im 65/78]
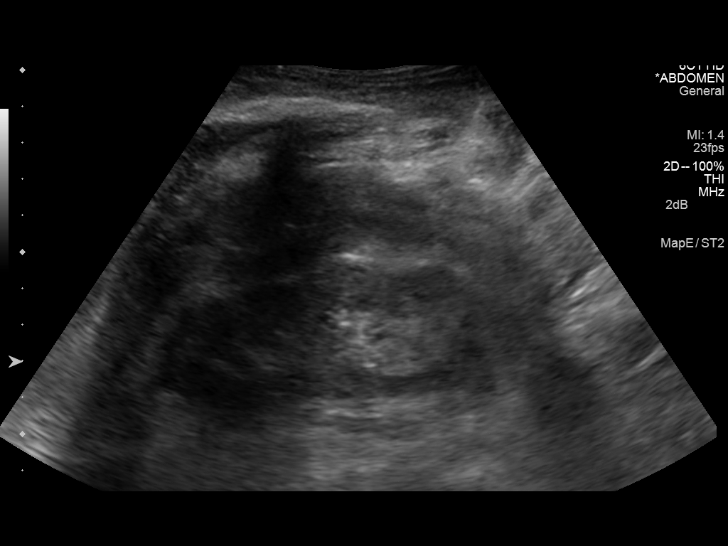
[im 71/78]
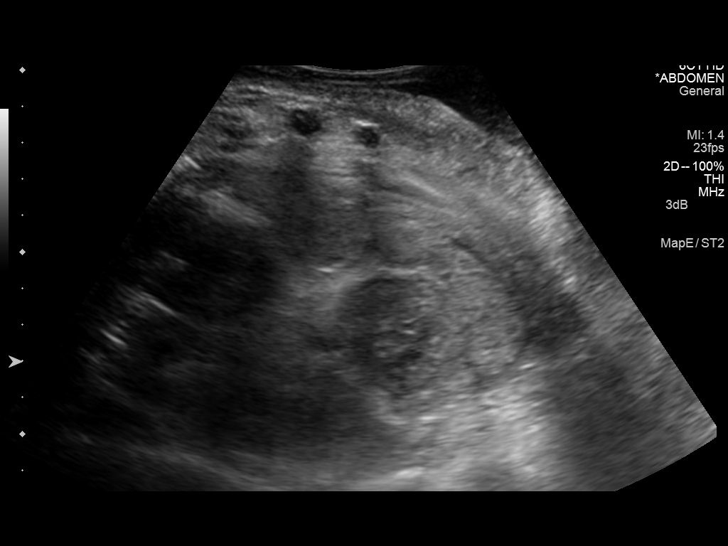
[im 78/78]
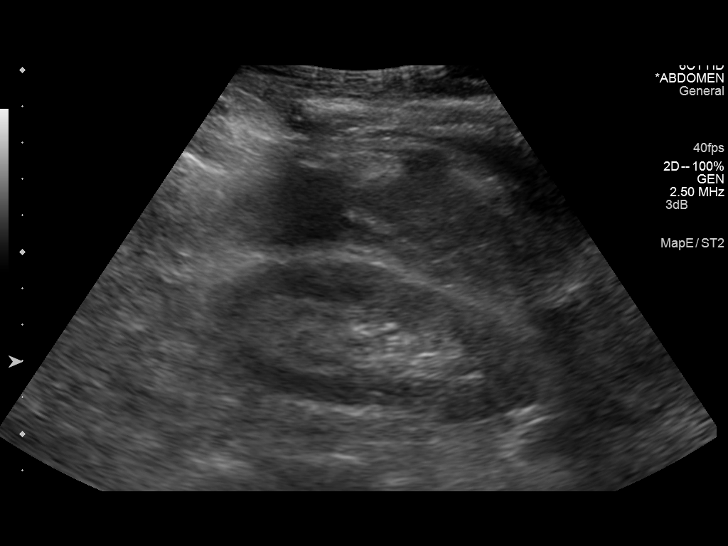

[13 of 25 positions shown; findings below may reference images not displayed]

FINDINGS: Gallbladder:

Two gallstones in the gallbladder measuring 1.4 cm in 1.2 cm in
maximum diameter each. No gallbladder wall thickening or
pericholecystic fluid. The patient was not focally tender over the
gallbladder.

Common bile duct:

Diameter: 8.0 mm

Liver:

Mildly lobulated, mildly heterogeneous and mildly echogenic.

IVC:

No abnormality visualized.

Pancreas:

Not well visualized. The pancreas was small and contained coarse
calcifications on the recent CT.

Spleen:

Size and appearance within normal limits.

Right Kidney:

Length: 11.8 cm. Echogenicity within normal limits. No mass or
hydronephrosis visualized.

Left Kidney:

Length: 10.1 cm. Echogenicity within normal limits. No mass or
hydronephrosis visualized.

Abdominal aorta:

Obscured distally by overlying bowel gas. The visualized portions
are normal in caliber.

Other findings:

No free peritoneal fluid.  Bilateral pleural fluid.
IMPRESSION: 1. Cholelithiasis without evidence of cholecystitis.
2. Changes of known cirrhosis of the liver.
3. Diffusely atrophied pancreas.
4. Bilateral pleural fluid.

## 2015-04-29 IMAGING — CR DG CHEST 1V PORT
1 series · 1 of 1 positions shown · non-contrast
Comparison: [DATE]

CLINICAL DATA: Followup pleural effusion

EXAM:
PORTABLE CHEST - 1 VIEW

[AP]
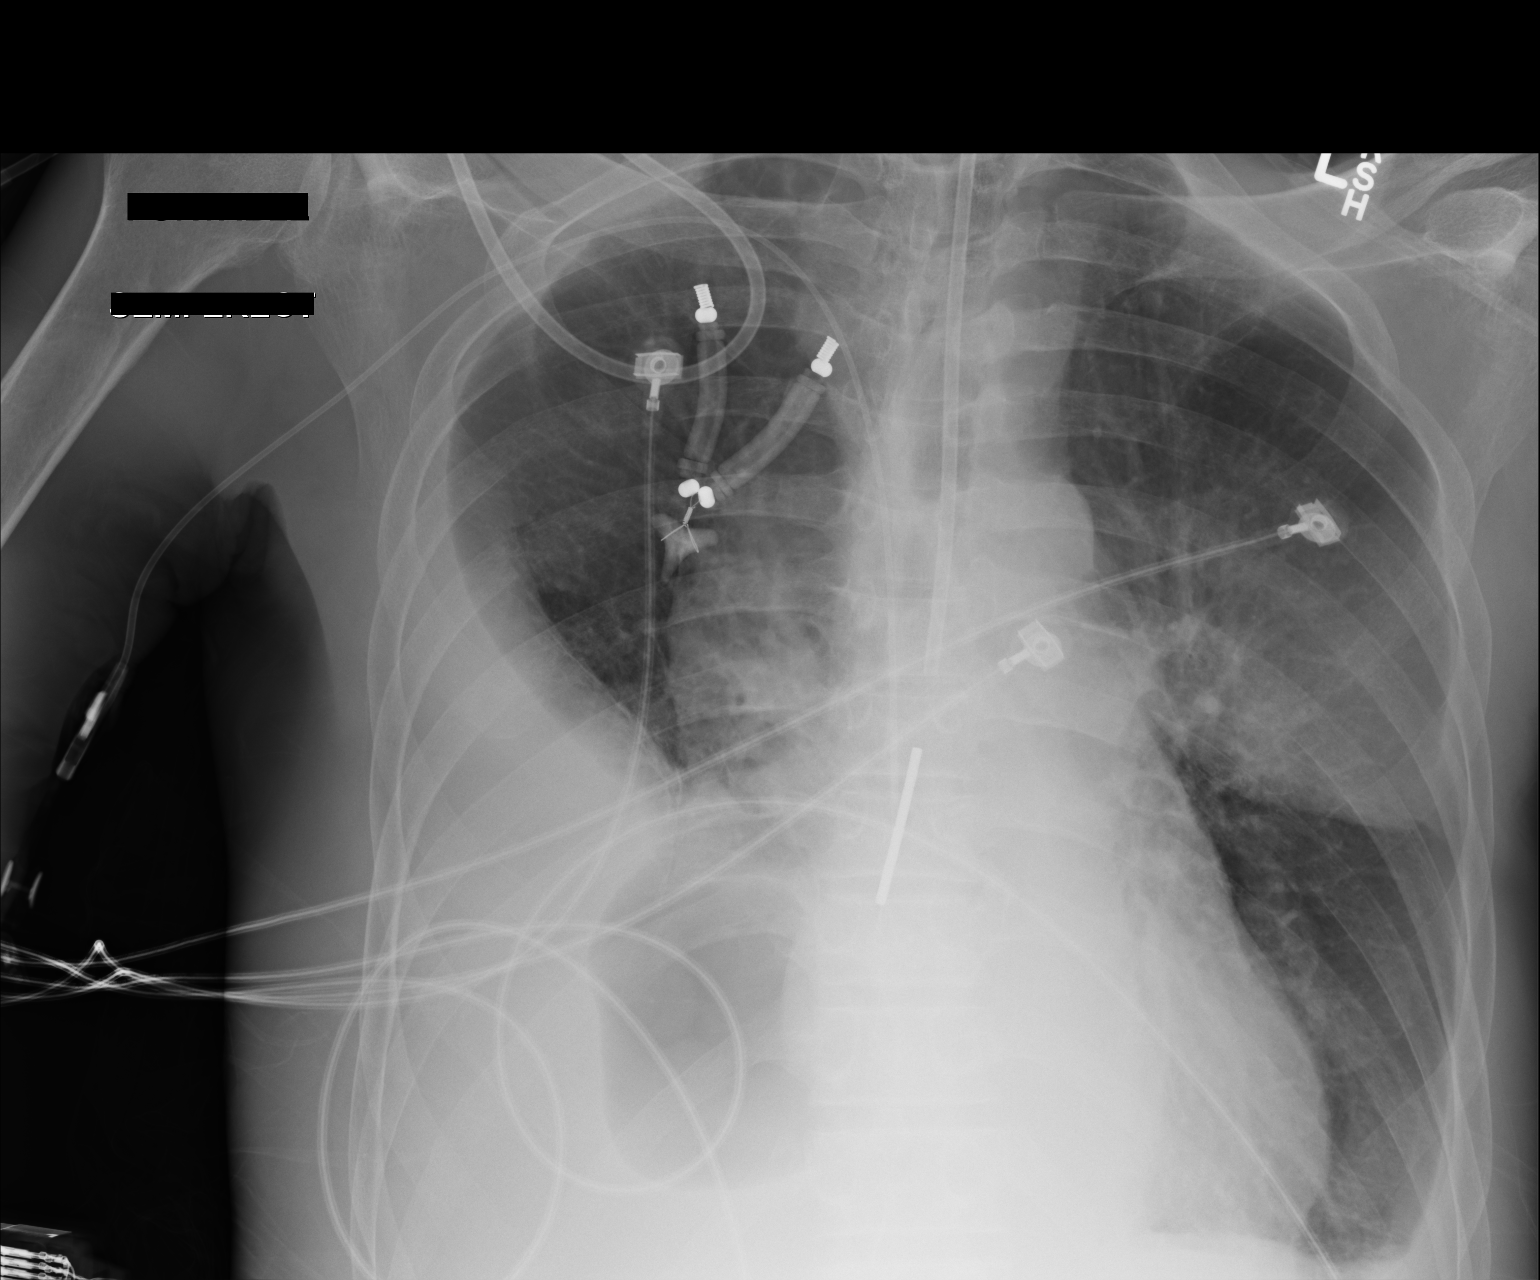

[1 of 1 positions shown; findings below may reference images not displayed]

FINDINGS: Cardiomediastinal silhouette is stable. Again noted moderate size
pleural effusion with right basilar atelectasis or infiltrate. Small
left pleural effusion. Stable right arm PICC line position. NG tube
with tip in distal esophagus. Adjustment of the NG tube is
recommended. No pulmonary edema.
IMPRESSION: NG tube with tip in distal esophagus. Adjustment of the NG tube is
recommended. Moderate size right pleural effusion with right basilar
atelectasis or infiltrate. Small left pleural effusion. No pulmonary
edema. Stable right arm PICC line position.
